# Patient Record
Sex: Female | Born: 1970 | Race: White | Hispanic: No | Marital: Married
Health system: Southern US, Community
[De-identification: ages and names within clinical notes are randomized; demographics above are authoritative.]

---

## 2008-03-11 ENCOUNTER — Ambulatory Visit: Payer: Self-pay

## 2008-05-12 ENCOUNTER — Ambulatory Visit: Payer: Self-pay

## 2011-08-23 ENCOUNTER — Ambulatory Visit: Payer: Self-pay

## 2011-08-31 ENCOUNTER — Ambulatory Visit: Payer: Self-pay

## 2011-09-12 ENCOUNTER — Encounter: Payer: Self-pay | Admitting: Maternal & Fetal Medicine

## 2011-09-12 LAB — URINALYSIS, COMPLETE
Bilirubin,UR: NEGATIVE
Glucose,UR: NEGATIVE mg/dL (ref 0–75)
Ketone: NEGATIVE
Nitrite: NEGATIVE
Ph: 8 (ref 4.5–8.0)
Protein: NEGATIVE
RBC,UR: 2 /HPF (ref 0–5)
WBC UR: 17 /HPF (ref 0–5)

## 2011-09-13 ENCOUNTER — Encounter: Payer: Self-pay | Admitting: Pediatric Cardiology

## 2011-09-17 ENCOUNTER — Observation Stay: Payer: Self-pay | Admitting: Obstetrics & Gynecology

## 2011-09-17 LAB — URINALYSIS, COMPLETE
Glucose,UR: NEGATIVE mg/dL (ref 0–75)
Ketone: NEGATIVE
Ph: 7 (ref 4.5–8.0)
Protein: NEGATIVE
Squamous Epithelial: 2

## 2011-09-29 ENCOUNTER — Encounter: Payer: Self-pay | Admitting: Obstetrics and Gynecology

## 2011-09-29 LAB — URINALYSIS, COMPLETE
Blood: NEGATIVE
Glucose,UR: NEGATIVE mg/dL (ref 0–75)
Ph: 7 (ref 4.5–8.0)
Protein: 30
Specific Gravity: 1.021 (ref 1.003–1.030)
Squamous Epithelial: 20

## 2011-09-30 ENCOUNTER — Observation Stay: Payer: Self-pay | Admitting: Obstetrics and Gynecology

## 2011-09-30 LAB — PIH PROFILE
Anion Gap: 12 (ref 7–16)
BUN: 10 mg/dL (ref 7–18)
Calcium, Total: 9 mg/dL (ref 8.5–10.1)
Chloride: 107 mmol/L (ref 98–107)
EGFR (African American): >60
Glucose: 102 mg/dL — ABNORMAL HIGH (ref 65–99)
HCT: 32.8 % — ABNORMAL LOW (ref 35.0–47.0)
HGB: 10.9 g/dL — ABNORMAL LOW (ref 12.0–16.0)
MCHC: 33.1 g/dL (ref 32.0–36.0)
Platelet: 140 10*3/uL — ABNORMAL LOW (ref 150–440)
Potassium: 3.6 mmol/L (ref 3.5–5.1)
RBC: 3.43 10*6/uL — ABNORMAL LOW (ref 3.80–5.20)
RDW: 14 % (ref 11.5–14.5)
SGOT(AST): 17 U/L (ref 15–37)
Uric Acid: 4.4 mg/dL (ref 2.6–6.0)
WBC: 7.8 10*3/uL (ref 3.6–11.0)

## 2011-09-30 LAB — PROTEIN / CREATININE RATIO, URINE
Creatinine, Urine: 59.4 mg/dL (ref 30.0–125.0)
Protein, Random Urine: 26 mg/dL — ABNORMAL HIGH (ref 0–12)

## 2011-10-01 ENCOUNTER — Observation Stay: Payer: Self-pay | Admitting: Advanced Practice Midwife

## 2011-10-03 ENCOUNTER — Observation Stay: Payer: Self-pay

## 2011-10-03 LAB — PIH PROFILE
Anion Gap: 11 (ref 7–16)
Calcium, Total: 8.7 mg/dL (ref 8.5–10.1)
Chloride: 108 mmol/L — ABNORMAL HIGH (ref 98–107)
Co2: 19 mmol/L — ABNORMAL LOW (ref 21–32)
Creatinine: 0.59 mg/dL — ABNORMAL LOW (ref 0.60–1.30)
EGFR (African American): >60
EGFR (Non-African Amer.): >60
HCT: 33.4 % — ABNORMAL LOW (ref 35.0–47.0)
HGB: 11.1 g/dL — ABNORMAL LOW (ref 12.0–16.0)
MCH: 31.8 pg (ref 26.0–34.0)
MCHC: 33.1 g/dL (ref 32.0–36.0)
Osmolality: 275 (ref 275–301)
Potassium: 3.9 mmol/L (ref 3.5–5.1)
RDW: 14.2 % (ref 11.5–14.5)
Sodium: 138 mmol/L (ref 136–145)
Uric Acid: 4.5 mg/dL (ref 2.6–6.0)
WBC: 5.6 10*3/uL (ref 3.6–11.0)

## 2011-10-03 LAB — PROTEIN / CREATININE RATIO, URINE: Protein, Random Urine: 46 mg/dL — ABNORMAL HIGH (ref 0–12)

## 2011-10-03 LAB — PROTEIN, URINE, 24 HOUR: Protein, 24 Hour Urine: 125 mg/24HR (ref 30–149)

## 2011-10-03 LAB — CREATININE, URINE, 24 HOUR: Creatinine, Urine: 114.1 mg/dL (ref 30.0–125.0)

## 2011-10-04 ENCOUNTER — Ambulatory Visit: Payer: Self-pay

## 2011-10-09 ENCOUNTER — Observation Stay: Payer: Self-pay | Admitting: Obstetrics and Gynecology

## 2011-10-13 ENCOUNTER — Encounter: Payer: Self-pay | Admitting: Obstetrics and Gynecology

## 2011-10-13 LAB — URINALYSIS, COMPLETE
Glucose,UR: NEGATIVE mg/dL (ref 0–75)
Ph: 6 (ref 4.5–8.0)
RBC,UR: 2 /HPF (ref 0–5)
Specific Gravity: 1.02 (ref 1.003–1.030)
Transitional Epi: <1
WBC UR: 28 /HPF (ref 0–5)

## 2011-10-15 ENCOUNTER — Inpatient Hospital Stay: Payer: Self-pay | Admitting: Obstetrics and Gynecology

## 2011-10-15 LAB — CBC WITH DIFFERENTIAL/PLATELET
Basophil #: 0 10*3/uL (ref 0.0–0.1)
Eosinophil %: 1.5 %
HCT: 32.9 % — ABNORMAL LOW (ref 35.0–47.0)
Lymphocyte %: 19.6 %
MCV: 95 fL (ref 80–100)
Monocyte %: 8.8 %
Platelet: 112 10*3/uL — ABNORMAL LOW (ref 150–440)
RDW: 14.5 % (ref 11.5–14.5)
WBC: 5.6 10*3/uL (ref 3.6–11.0)

## 2011-10-15 LAB — PROTEIN, URINE, 24 HOUR
Collection Hours: 24 hours
Protein, 24 Hour Urine: 263 mg/24HR — ABNORMAL HIGH (ref 30–149)
Total Volume: 1750 mL

## 2011-10-19 LAB — PATHOLOGY REPORT

## 2012-12-07 IMAGING — US US OB DETAIL+14 WK - NRPT [facility]
1 series · 14 of 28 positions shown · non-contrast
Comparison: none

[Series 1: us ob detail+14 wk - nrpt [facility] · 14 of 82 slices shown]
[im 4/82]
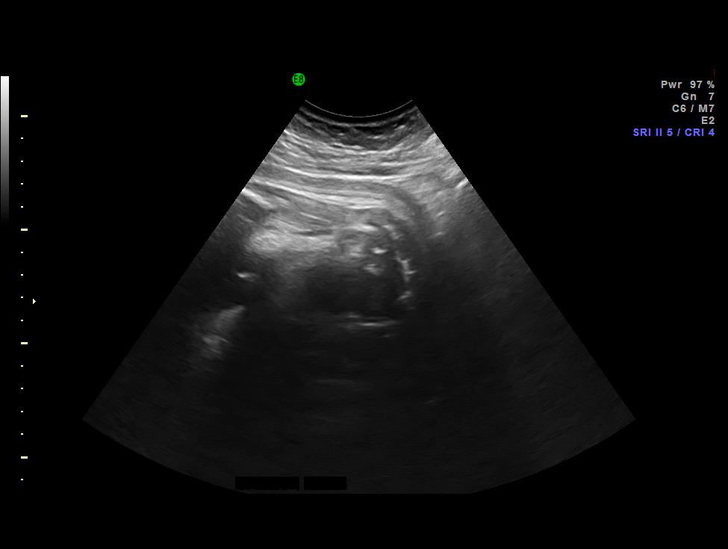
[im 10/82]
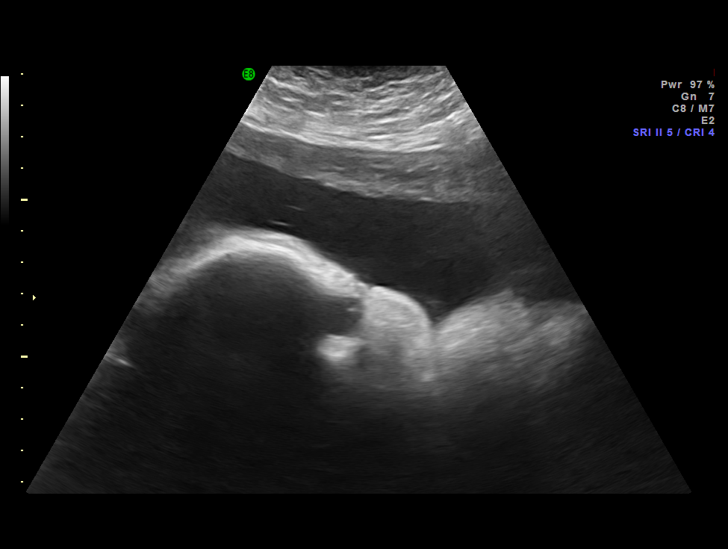
[im 16/82]
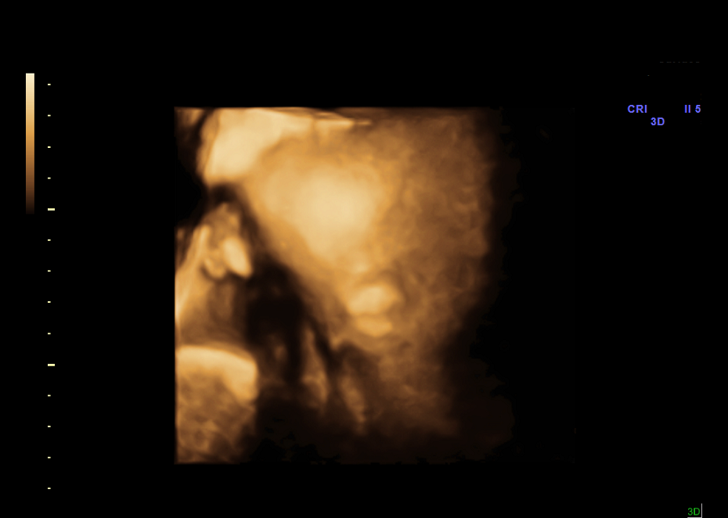
[im 22/82]
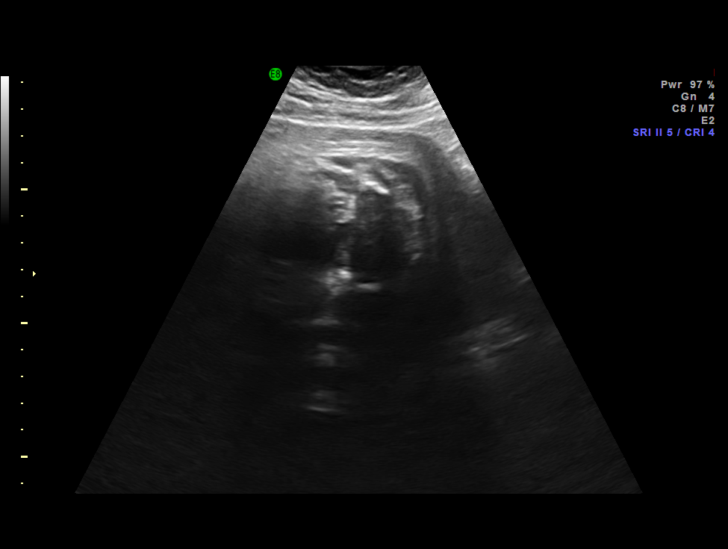
[im 28/82]
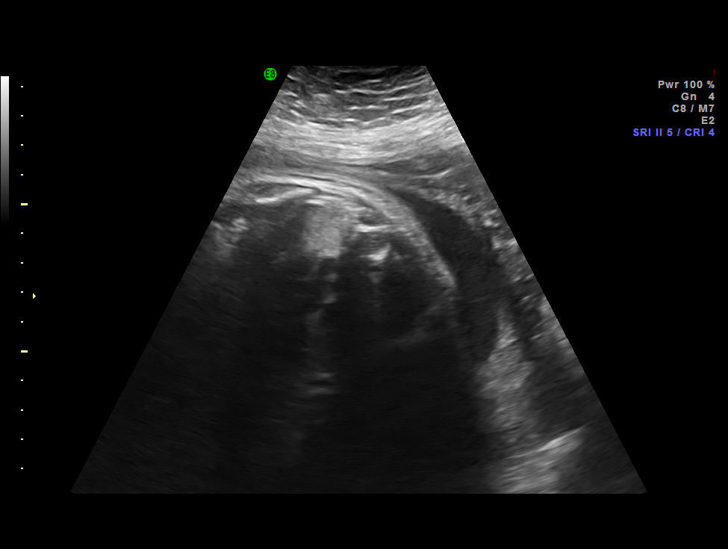
[im 34/82]
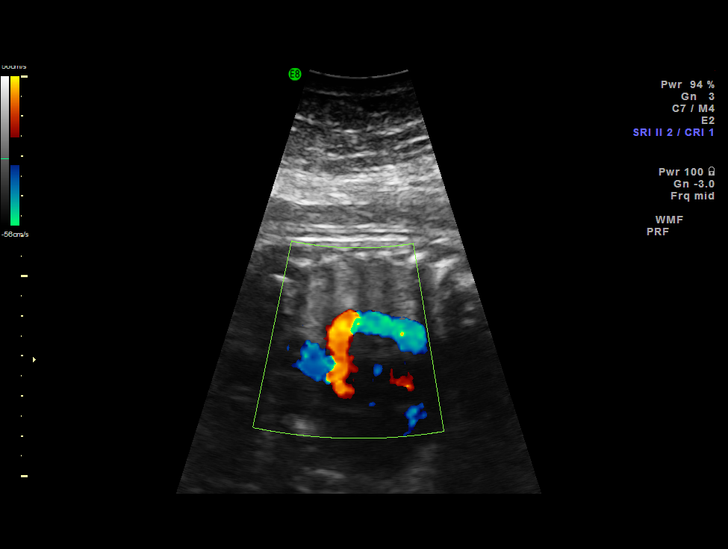
[im 40/82]
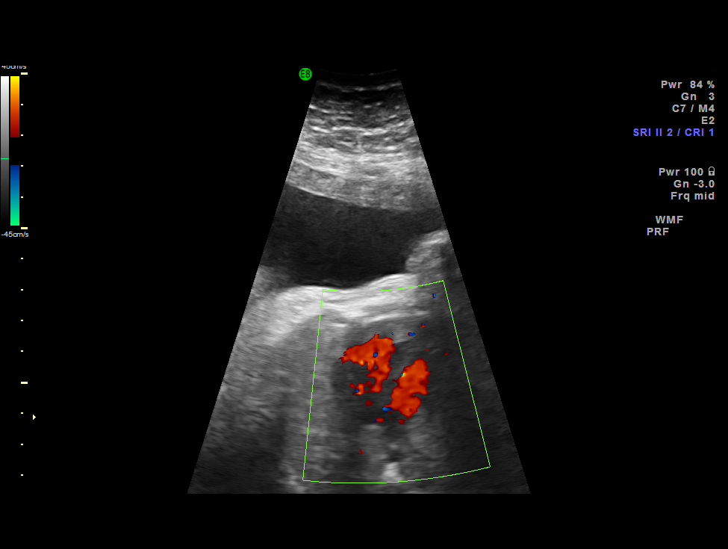
[im 46/82]
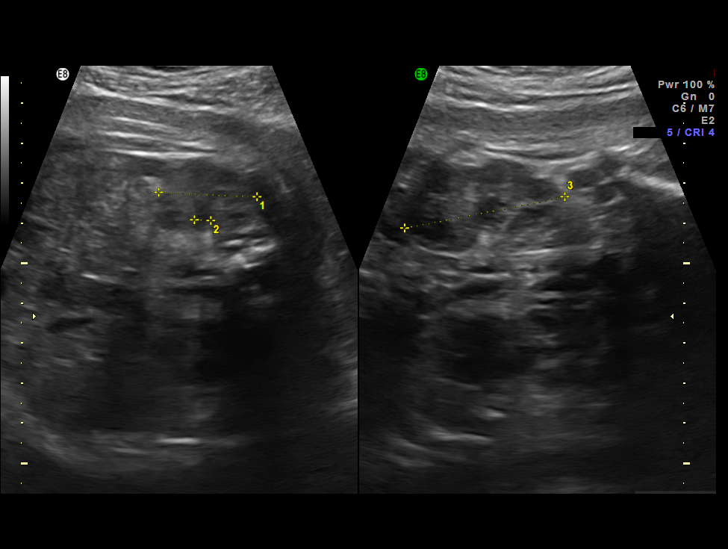
[im 52/82]
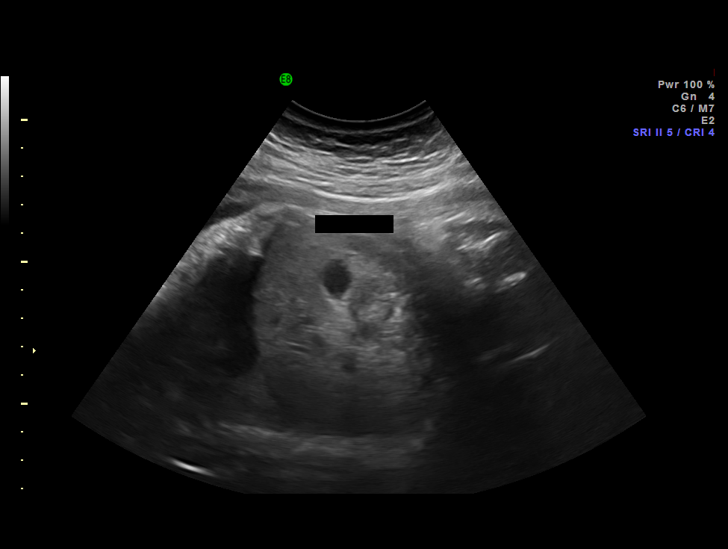
[im 58/82]
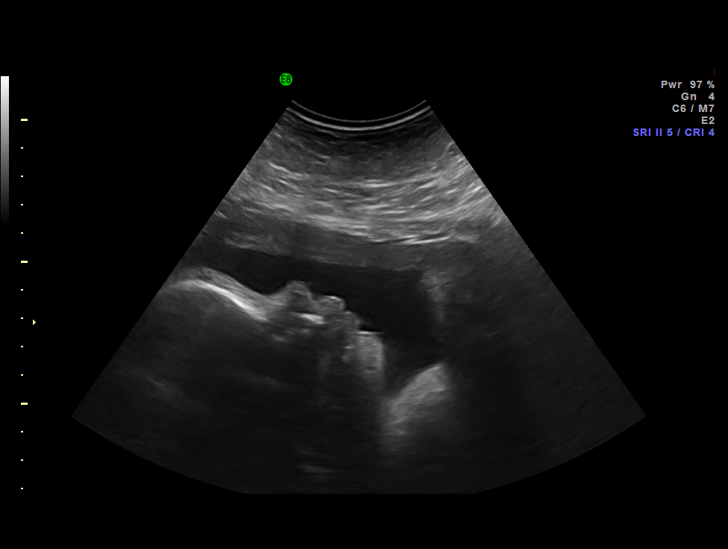
[im 64/82]
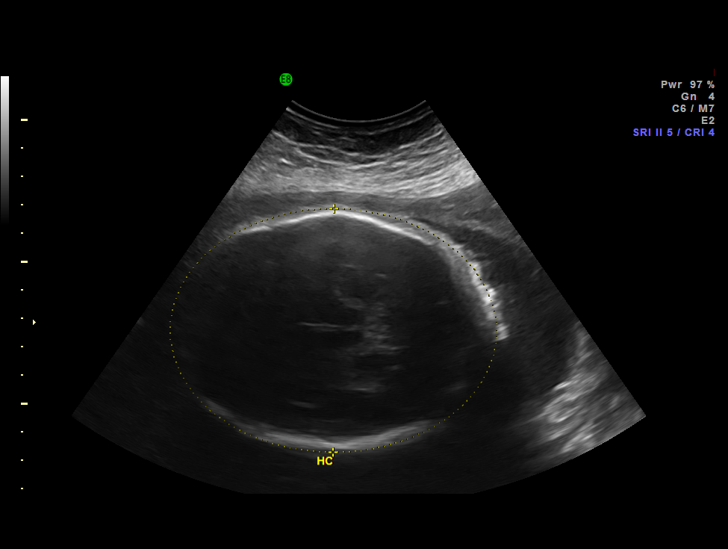
[im 70/82]
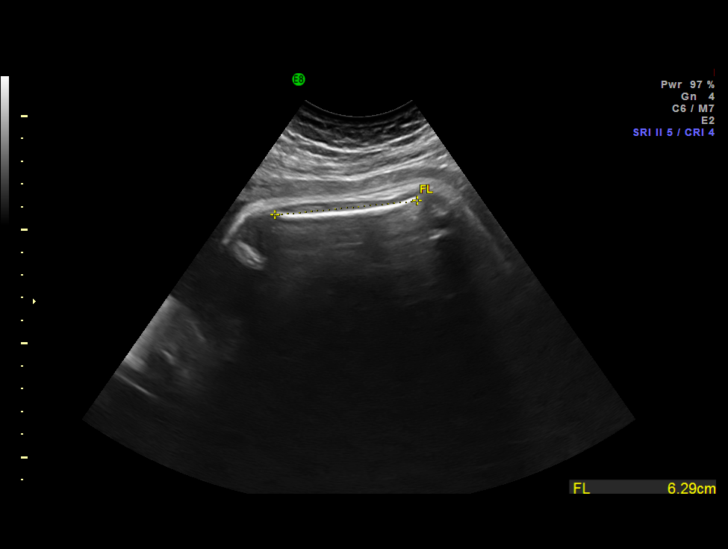
[im 76/82]
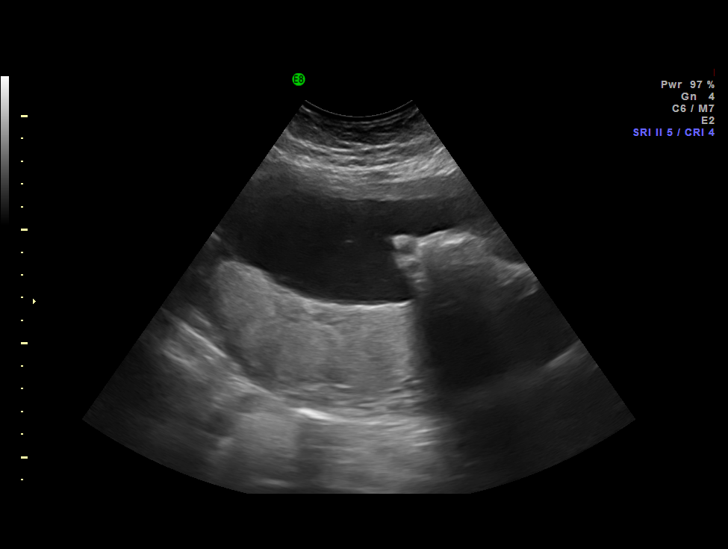
[im 82/82]
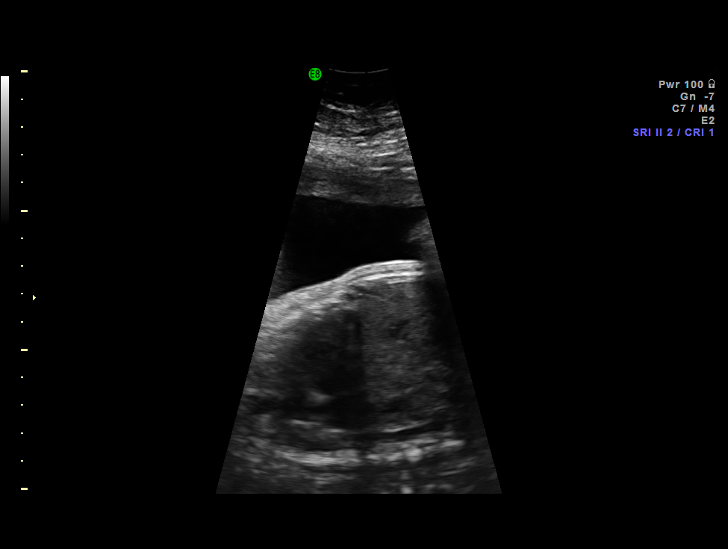

[14 of 28 positions shown; findings below may reference images not displayed]

IMAGES IMPORTED FROM THE SYN[employer] WORKFLOW SYSTEM
NO DICTATION FOR STUDY

## 2013-01-07 IMAGING — US US FETAL BPP W/O NON-STRESS - NRPT
1 series · 9 of 9 positions shown · non-contrast
Comparison: none

[Series 1: us fetal bpp w/o non-stress - nrpt · 0.26mm/px · 9 of 9 slices shown]
[im 1/9]
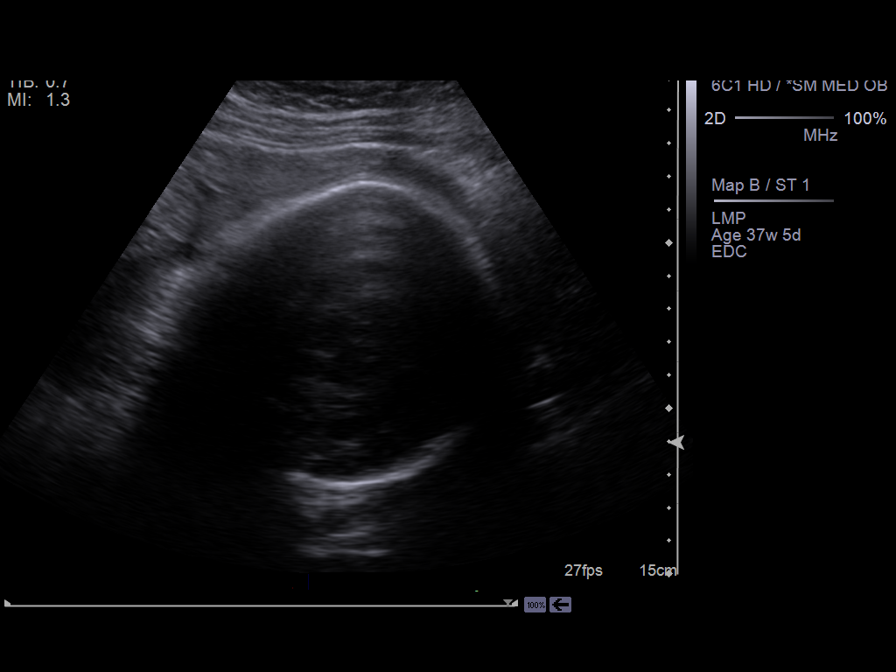
[im 2/9]
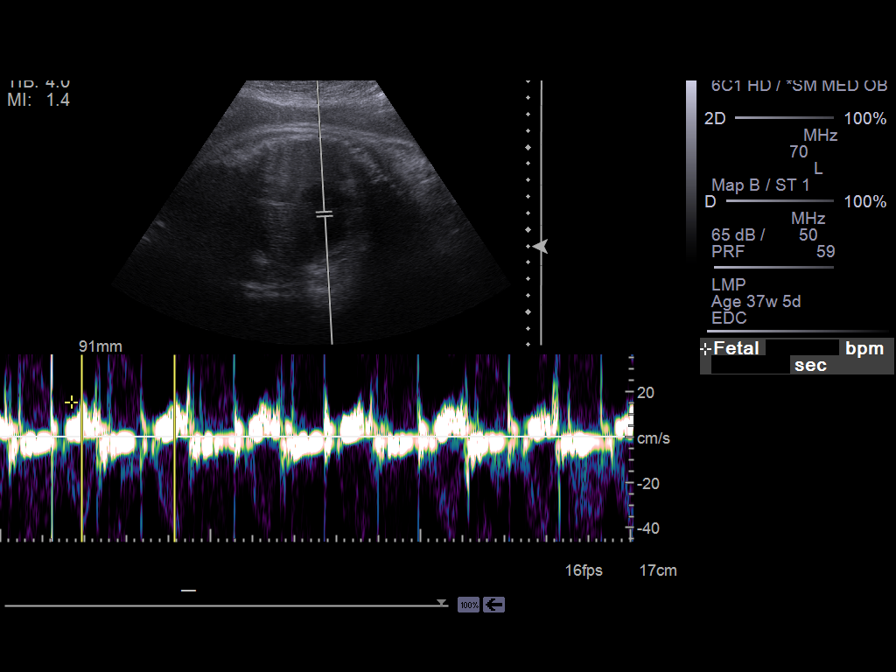
[im 3/9]
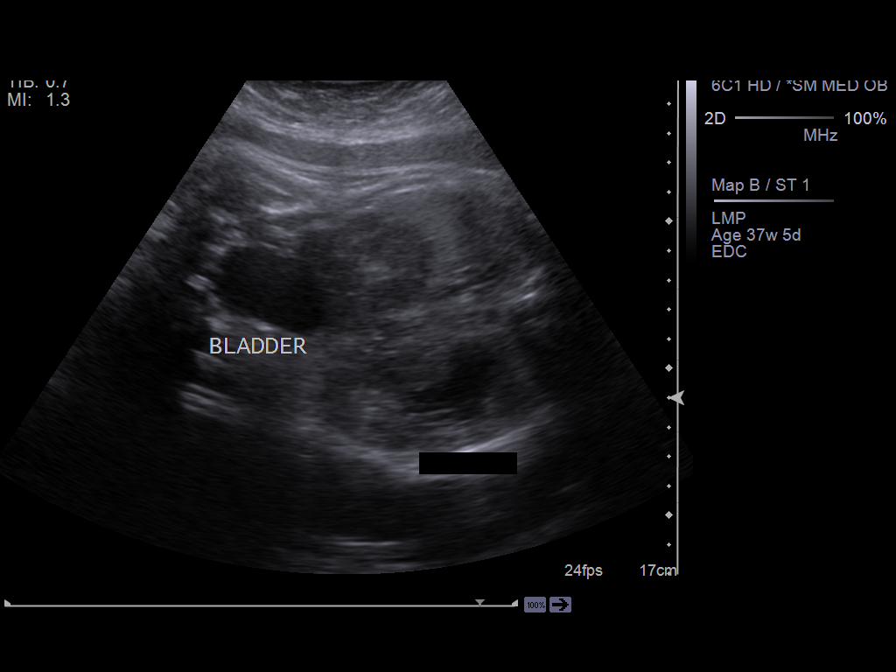
[im 4/9]
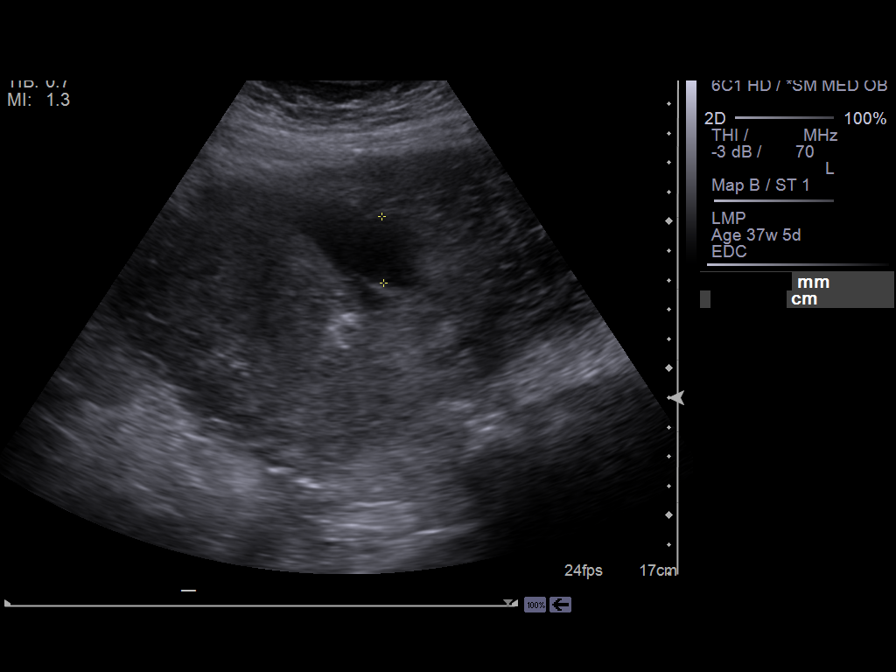
[im 5/9]
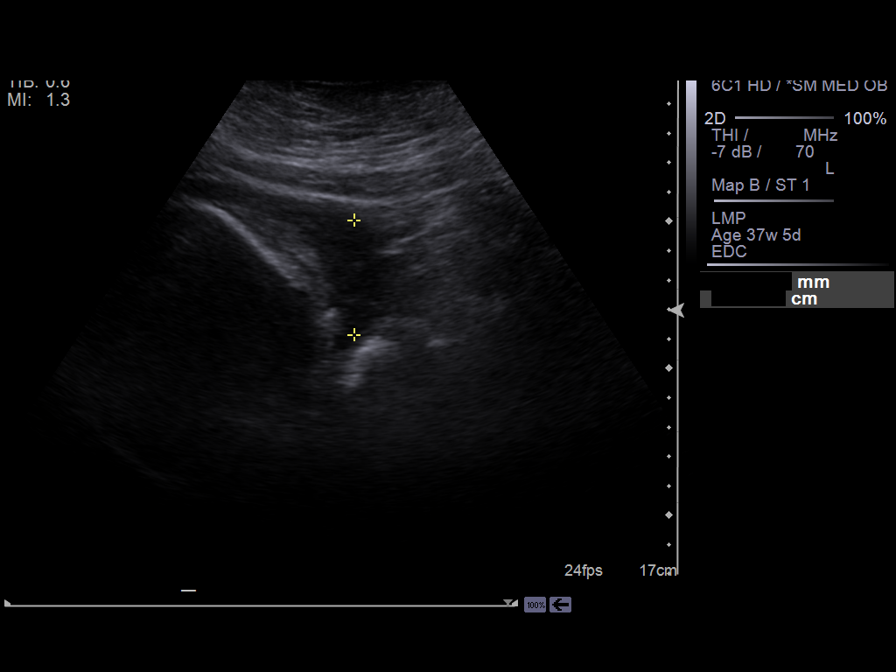
[im 6/9]
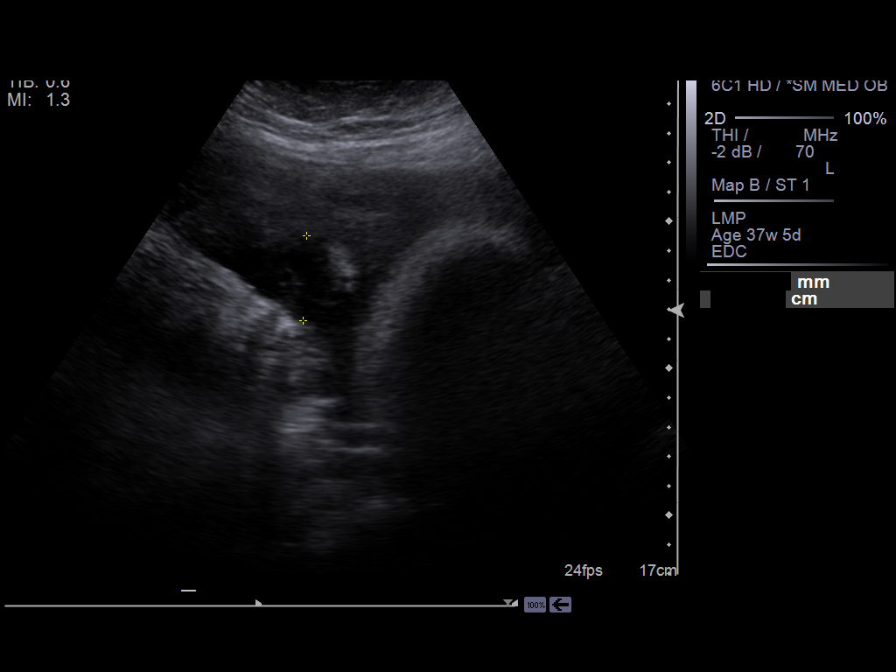
[im 7/9]
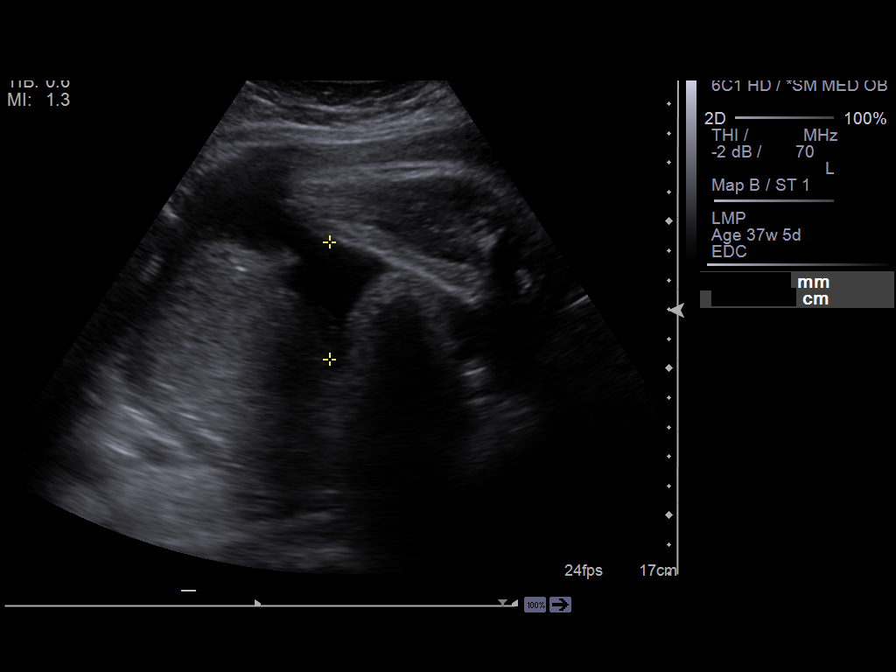
[im 8/9]
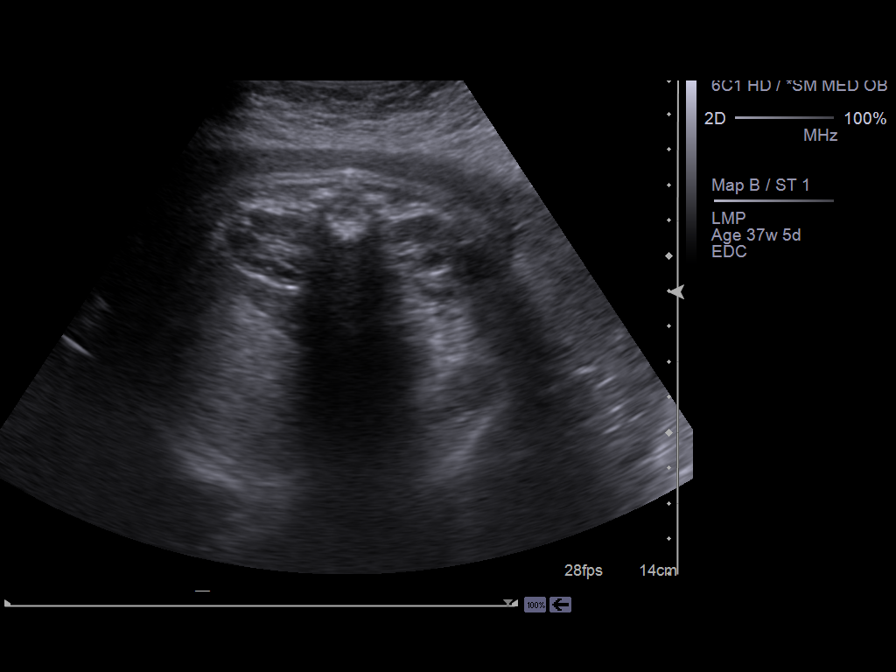
[im 9/9]
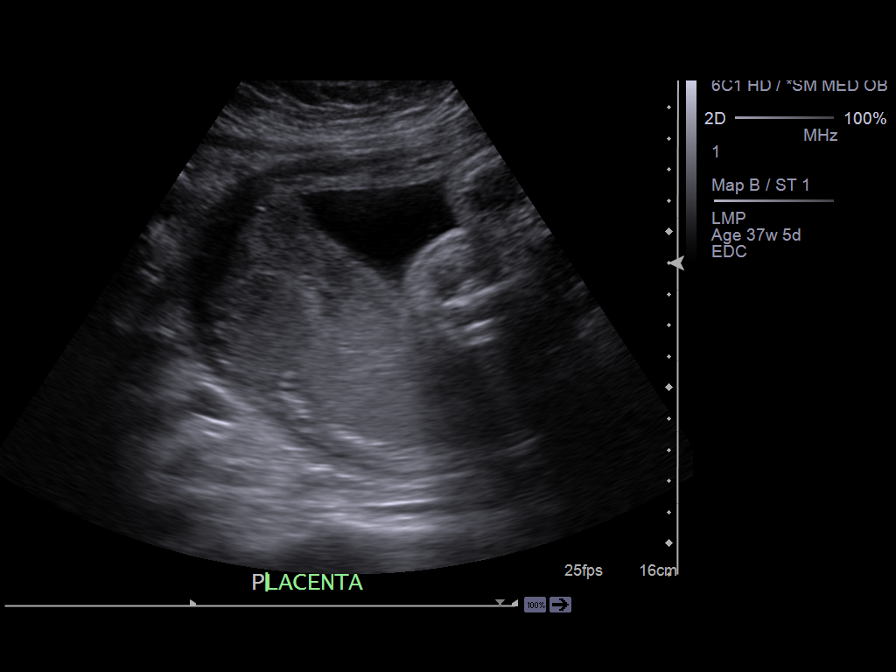

[9 of 9 positions shown; findings below may reference images not displayed]

IMAGES IMPORTED FROM THE SYNGO WORKFLOW SYSTEM
NO DICTATION FOR STUDY

## 2013-10-11 IMAGING — US US FETAL BPP W/O NON-STRESS - NRPT
1 series · 12 of 12 positions shown · non-contrast
Comparison: none

[Series 1: us fetal bpp w/o non-stress - nrpt · 0.28mm/px · 12 acquisitions, 12 frames shown]
[im 1/12]
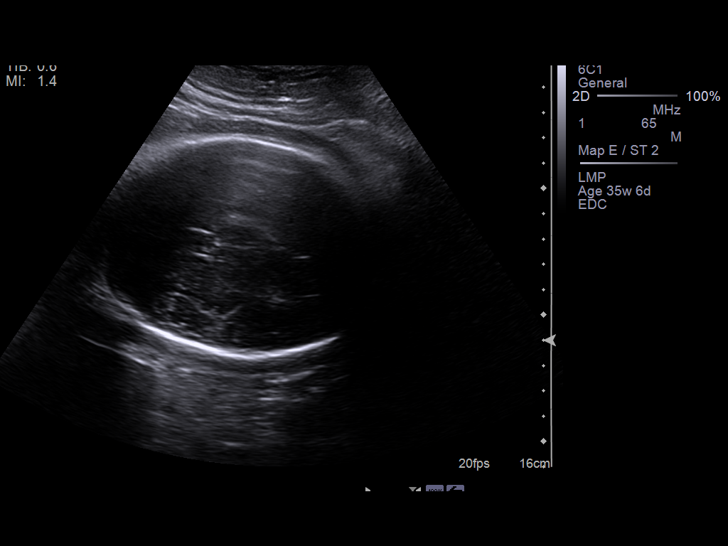
[im 2/12]
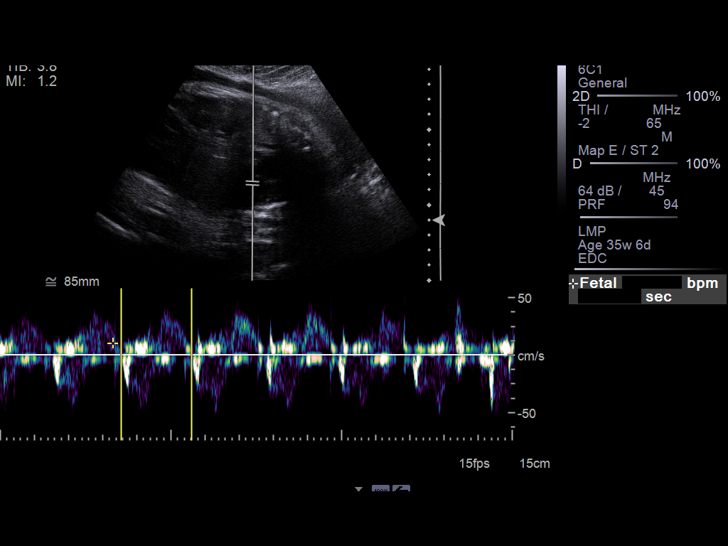
[im 3/12]
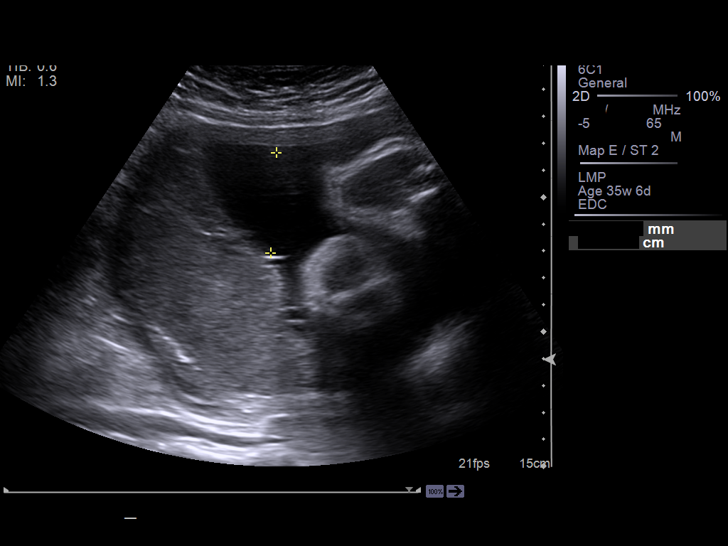
[im 4/12]
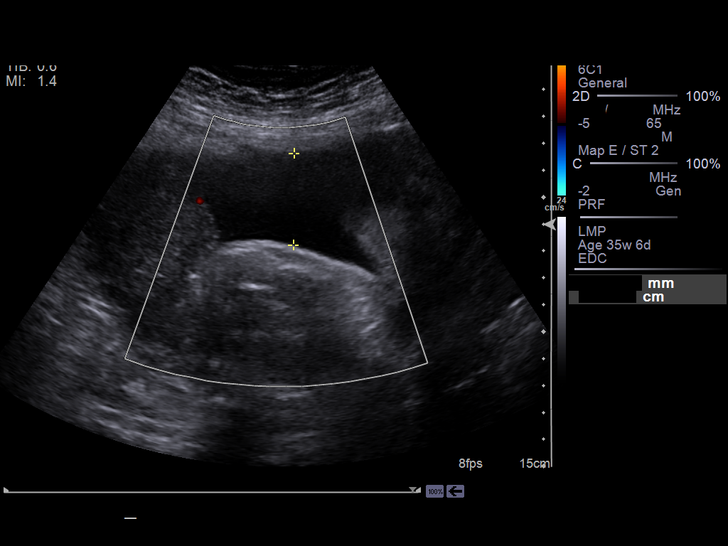
[im 5/12]
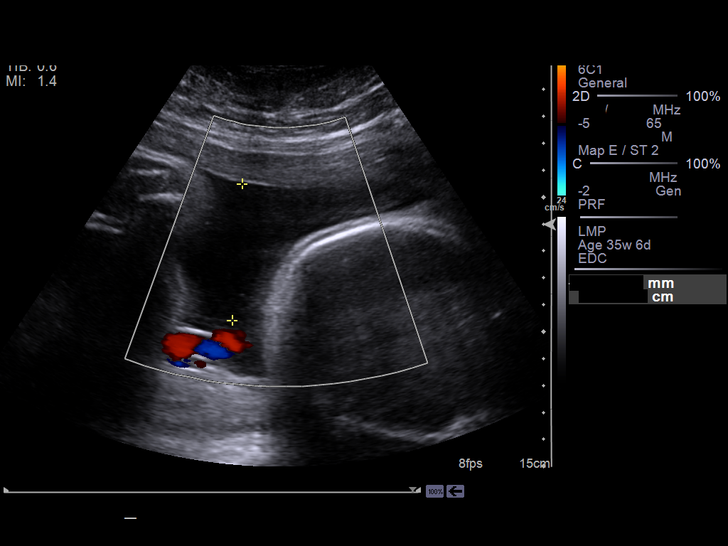
[im 6/12]
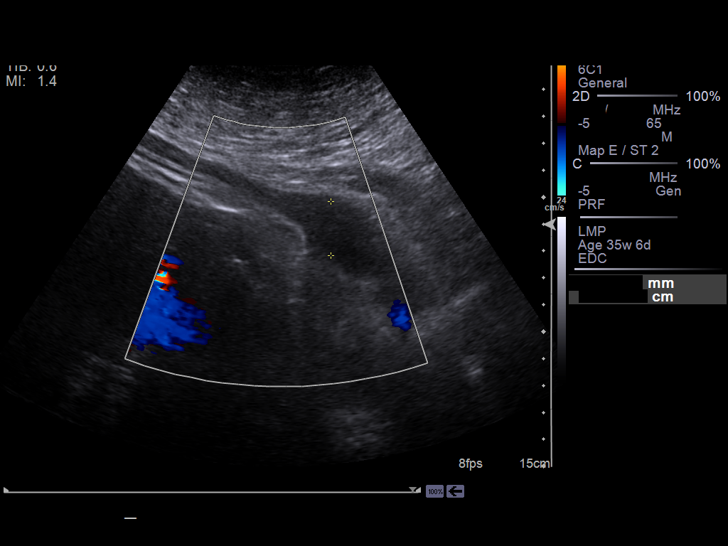
[im 7/12]
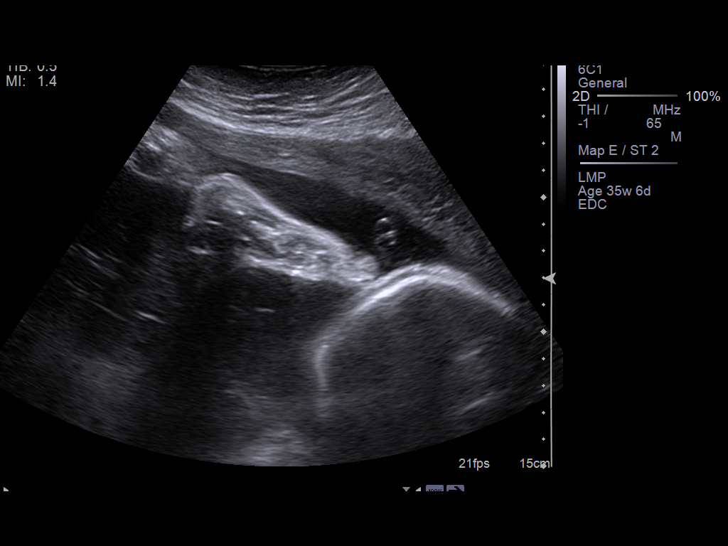
[im 8/12]
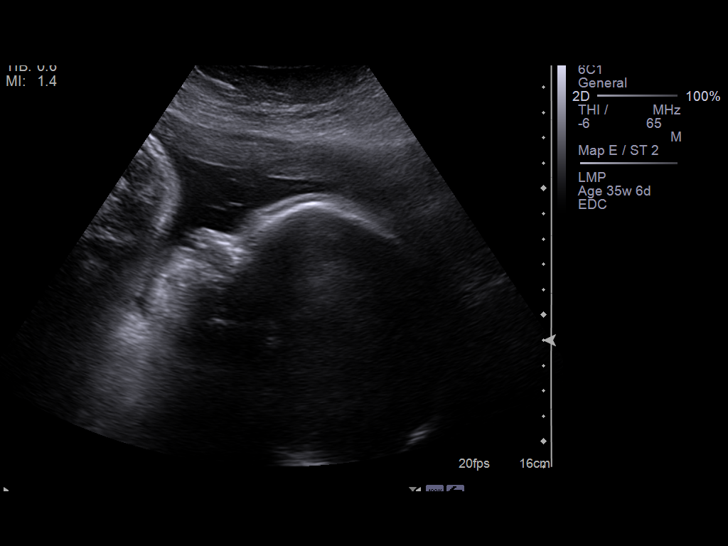
[im 9/12]
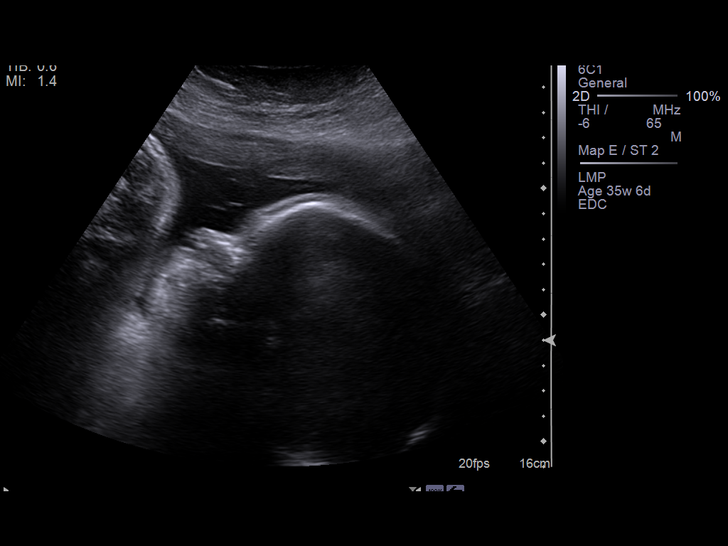
[im 10/12]
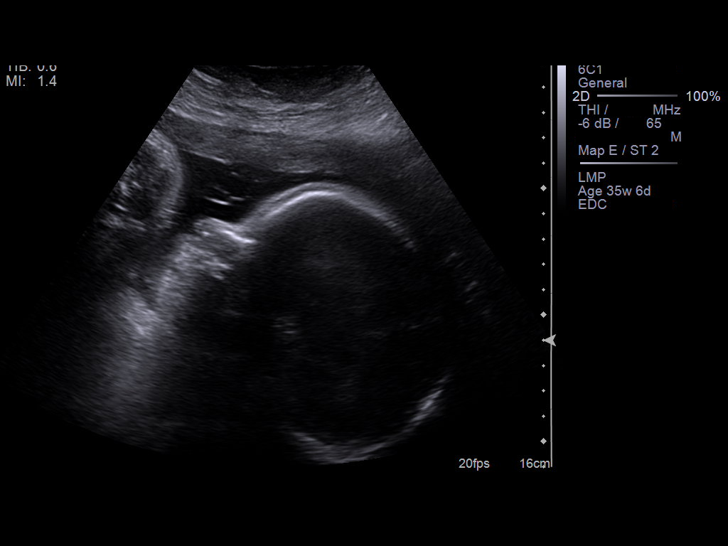
[im 11/12]
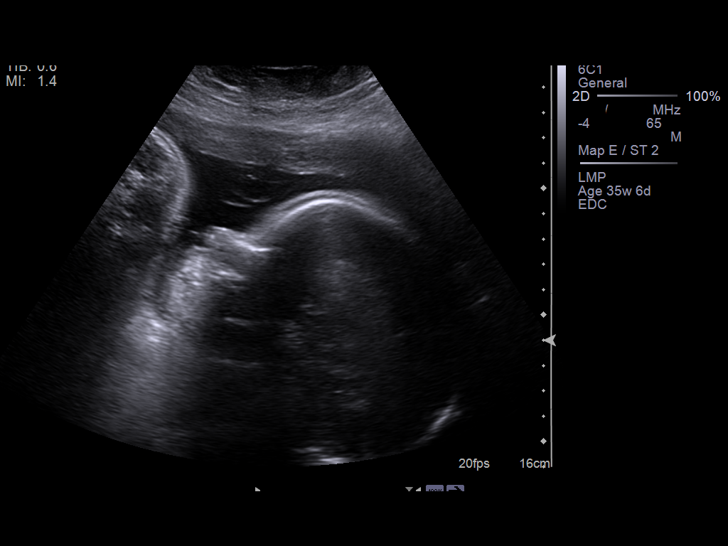
[im 12/12]
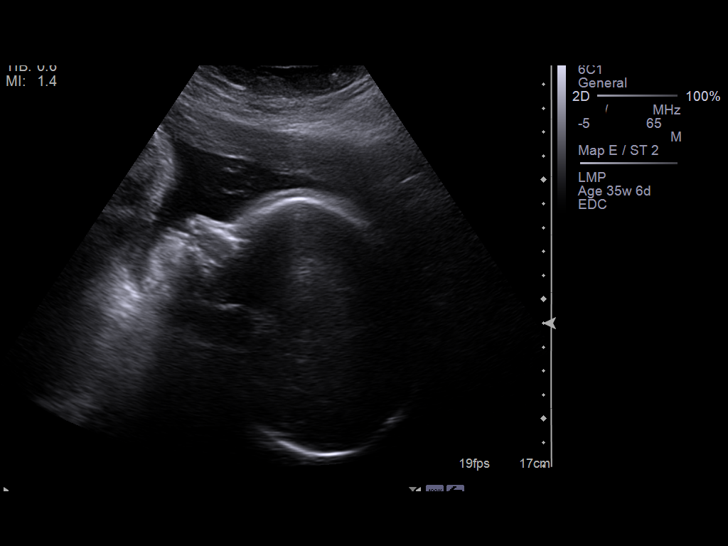

[12 of 12 positions shown; findings below may reference images not displayed]

IMAGES IMPORTED FROM THE SYNGO WORKFLOW SYSTEM
NO DICTATION FOR STUDY

## 2013-12-27 ENCOUNTER — Emergency Department: Payer: Self-pay | Admitting: Emergency Medicine

## 2013-12-27 LAB — URINALYSIS, COMPLETE
Bacteria: NONE SEEN
Bilirubin,UR: NEGATIVE
Glucose,UR: NEGATIVE mg/dL (ref 0–75)
KETONE: NEGATIVE
LEUKOCYTE ESTERASE: NEGATIVE
Nitrite: NEGATIVE
PH: 6 (ref 4.5–8.0)
PROTEIN: NEGATIVE
SPECIFIC GRAVITY: 1.025 (ref 1.003–1.030)
Squamous Epithelial: 1
WBC UR: <1 /HPF (ref 0–5)

## 2013-12-27 LAB — COMPREHENSIVE METABOLIC PANEL
ALBUMIN: 3.2 g/dL — AB (ref 3.4–5.0)
ALK PHOS: 75 U/L
Anion Gap: 3 — ABNORMAL LOW (ref 7–16)
BUN: 16 mg/dL (ref 7–18)
Bilirubin,Total: 0.2 mg/dL (ref 0.2–1.0)
CALCIUM: 8.2 mg/dL — AB (ref 8.5–10.1)
CREATININE: 0.83 mg/dL (ref 0.60–1.30)
Chloride: 108 mmol/L — ABNORMAL HIGH (ref 98–107)
Co2: 28 mmol/L (ref 21–32)
EGFR (Non-African Amer.): >60
Glucose: 103 mg/dL — ABNORMAL HIGH (ref 65–99)
OSMOLALITY: 279 (ref 275–301)
POTASSIUM: 4.1 mmol/L (ref 3.5–5.1)
SGOT(AST): 23 U/L (ref 15–37)
SGPT (ALT): 29 U/L
SODIUM: 139 mmol/L (ref 136–145)
Total Protein: 6.9 g/dL (ref 6.4–8.2)

## 2013-12-27 LAB — CBC WITH DIFFERENTIAL/PLATELET
Basophil #: 0 10*3/uL (ref 0.0–0.1)
Basophil %: 0.7 %
Eosinophil #: 0.2 10*3/uL (ref 0.0–0.7)
Eosinophil %: 4.2 %
HCT: 27.1 % — AB (ref 35.0–47.0)
HGB: 8.6 g/dL — ABNORMAL LOW (ref 12.0–16.0)
LYMPHS PCT: 32 %
Lymphocyte #: 1.8 10*3/uL (ref 1.0–3.6)
MCH: 29.5 pg (ref 26.0–34.0)
MCHC: 31.8 g/dL — ABNORMAL LOW (ref 32.0–36.0)
MCV: 93 fL (ref 80–100)
MONO ABS: 0.5 x10 3/mm (ref 0.2–0.9)
Monocyte %: 8.8 %
Neutrophil #: 3.1 10*3/uL (ref 1.4–6.5)
Neutrophil %: 54.3 %
PLATELETS: 203 10*3/uL (ref 150–440)
RBC: 2.92 10*6/uL — ABNORMAL LOW (ref 3.80–5.20)
RDW: 14.2 % (ref 11.5–14.5)
WBC: 5.8 10*3/uL (ref 3.6–11.0)

## 2013-12-27 LAB — PREGNANCY, URINE: PREGNANCY TEST, URINE: NEGATIVE m[IU]/mL

## 2014-08-24 NOTE — Consult Note (Signed)
Referral Information:   Reason for Referral 44 yo G2P1001 at 353 weeks 5 days by LMP (EDD 10/29/11) consistent with 6w 5 day [facility].  She presents for follow up review of her blood sugars since being started on glyburide.    Referring Physician [facility]    Prenatal Hx Pregnancy also complicated by depression (was on Paxil, now on Zoloft), AMA, hypothyroidism, elevated BMI.    Past Obstetrical Hx 1996, SVD of live female , 7lb 9 oz, no complications, at term   Home Medications: Medication Instructions Status  Zoloft 25 mg oral tablet tab(s) orally  Active  glyBURIDE 2.5 mg oral tablet 1 tab(s) orally once a day (at bedtime) Active  Prenatal Multivitamins oral tablet 1 tab(s) orally once a day Active  glyBURIDE 1.25 mg oral tablet 1 tab(s) orally once a day (in the morning) Active  Levoxyl 100 mcg (0.1 mg) oral tablet 1 tab(s) orally once a day Active  Levoxyl 112 mcg (0.112 mg) oral tablet 1 tab(s) orally once a day Active   Allergies:   No Known Allergies:   Vital Signs/Notes:  Nursing Vital Signs: **Vital Signs.:   30-May-13 13:12   Vital Signs Type Routine   Pulse Pulse 91   Pulse source per Dinamap   Respirations Respirations 16   Systolic BP Systolic BP 138   Diastolic BP (mmHg) Diastolic BP (mmHg) 83   Mean BP 101   BP Source vital sign device   Impression/Recommendations:   Impression 44 year old with gestational diabetes currently at 35 weeks 5 days. Current dose of glyburide is 2.5mg  qhs and 1.25mg  q morning (was having lows in the middle of the night witih 5mg  qhs dose). FBS range from 80 to 121 (outlier) 2hr PP breakfast range from 101 to 160 (95% in range) 2hr PP lunch range from 89 go 143 (95% in range) 2hr PP dinner range from 91 to 149 (99% in range)    Recommendations 1.  Continue diabetic diet and exercise --Continue current glyburide dose --Recommend 2x weekly NSTs and weekly fluid checks --Given normal EFW at 33 weeks  (62nd percentile), recommend EFW around 37 weeks and assuming no evidence of fetopathy, consider delivery at about 39 weeks.  Note that first delivery was a vaginal after induction of labor (16 years ago!) 2.  Depression currently on Zoloft.  Had a "grossly normal" fetal echocardiogram (due to early Paxil exposure and AMA status).  She plans to resume Paxil postpartum since it has worked better for her.  Monitor for PP depression. 3. Follow up in 2 weeks (with BPP). Scheduled.  4.  Follow up EFW was not scheduled; we would be more than happy to perform this if desired.     Total Time Spent with Patient 10 minutes    >50% of visit spent in couseling/coordination of care yes    Office Use Only [id number]  Office Visit Level 2 (10min) EST prob focused office/outpt   Coding Description: MATERNAL CONDITIONS/HISTORY INDICATION(S).   Adv. Maternal Age - multigravida.   Diabetes - Gestational GDM.   Obesity - BMI greater than equal to 30.  Electronic Signatures: Kirby FunkEllestad, Tajia Szeliga (MD)  (Signed 30-May-13 13:48)  Authored: Referral, Home Medications, Allergies, Vital Signs/Notes, Impression, Billing, Coding Description   Last Updated: 30-May-13 13:48 by Kirby FunkEllestad, Vivica Dobosz (MD)

## 2014-08-24 NOTE — Consult Note (Signed)
Referral Information:   Reason for Referral Review sugar log    Referring Physician [facility]    Prenatal Hx 44 year-old G2 p1001 at 4737 5/7 weeks wtih gestational diabetes here to review her sugar log. She is currently taking glyburide 1.125 mg in the mornnign and 2.5 mg at bedtime.  She is without complaints. She states she is mostly following her diet but occasionally eats things she knows that she shouldn't.  She had a fetal echo for the indication of Paxil exposure, which was grossly normal.  She is [facility] and has a growth ultrasound scheduled there today.    Past Obstetrical Hx See prior consult notes.   Home Medications: Medication Instructions Status  Zoloft 25 mg oral tablet tab(s) orally  Active  glyBURIDE 2.5 mg oral tablet 1 tab(s) orally once a day (at bedtime) Active  Prenatal Multivitamins oral tablet 1 tab(s) orally once a day Active  glyBURIDE 1.25 mg oral tablet 1 tab(s) orally once a day (in the morning) Active  Levoxyl 100 mcg (0.1 mg) oral tablet 1 tab(s) orally once a day Active  Levoxyl 112 mcg (0.112 mg) oral tablet 1 tab(s) orally once a day Active   Allergies:   No Known Allergies:   Vital Signs/Notes:  Nursing Vital Signs: **Vital Signs.:   13-Jun-13 08:43   Pulse Pulse 97   Systolic BP Systolic BP 117   Diastolic BP (mmHg) Diastolic BP (mmHg) 71     Additional Lab/Radiology Notes Blood sugar log (5/30-6/13): FS: 94, 99, 111, 76, 88, 115, 106, 110, 117, 120, 113, 112, 98, 108 2hr PP breakfast: 130, 133, 99, 105, 94, 98, 129, 109, 118, 105 2hr PP lunch: 95, 88, 108, 79, 127 2hr PP dinner: 117, 123, 99, 99, 113, 130, 121, 141, 118   Impression/Recommendations:   Impression 44 year-old G2 P1001 at 2437 5/7 weeks with gestational diabetes on glyburide.  Fasting sugars have slowly crept up in the last two weeks while on current treatment regimen. 2hr values are mostly in range.    Recommendations -Change  glyburide to 5 mg at bedtime and continue 1.125 mg in the am -Continue to follow diet and exercise recommnedations -Continue twice-weekly fetal testing -[facility]. If still no evidence of fetopathy on that scan, than recommend coninuation of twice weekly testing and delivery at approximately 39 weeks.  If evidence of fetaopathy on today's scan (macrosomia, polyhydramnios) than recommend delivery at 37-38 weeks with documentation of fetal lung maturity. -Pt at risk for postpartum depression. Continue SSRI. Pt aware of symptoms and knows to call. Recommend early followup postpartum to screen for symptoms.     Total Time Spent with Patient 15 minutes    >50% of visit spent in couseling/coordination of care yes    Office Use Only [id number]  Office Visit Level 3 (15min) EST exp prob focused outpt   Coding Description: MATERNAL CONDITIONS/HISTORY INDICATION(S).   Adv. Maternal Age - multigravida.   Diabetes - Gestational GDM.  Electronic Signatures: Ladan Vanderzanden, [place] (MD)  (Signed 13-Jun-13 09:08)  Authored: Referral, Home Medications, Allergies, Vital Signs/Notes, Lab/Radiology Notes, Impression, Billing, Coding Description   Last Updated: 13-Jun-13 09:08 by Charle Mclaurin, [place]had (MD)

## 2014-08-24 NOTE — Op Note (Signed)
PATIENT NAME:  Emily Gonzales, Emily Gonzales MR#:  [id number] DATE OF [place]:  1971/03/16  DATE OF PROCEDURE:  10/15/2011  PREOPERATIVE DIAGNOSES:  1. Intrauterine pregnancy at term.  2. Fetal intolerance of labor.  3. Desires permanent sterilization.   POSTOPERATIVE DIAGNOSES: 1. Intrauterine pregnancy at term.  2. Fetal intolerance of labor.  3. Desires permanent sterilization.   PROCEDURES:  1. Low transverse Cesarean section via Pfannenstiel incision.  2. Bilateral tubal ligation.   ANESTHESIA: Spinal.   SURGEON: Conard NovakStephen Gonzales. Aubree Doody, MD   ASSISTANT: Shella Maximarcia Putnam, CNM   ESTIMATED BLOOD LOSS: 600 mL.  OPERATIVE FLUIDS: 1900 mL of Crystalloid.   COMPLICATIONS: None.   FINDINGS:  1. Gravid uterus with subserosal appearing left fundal fibroid (3 x 3 cm), otherwise normal.  2. Normal appearing bilateral fallopian tubes and ovaries.   SPECIMENS: Portion of right and left tubes to Pathology for permanent.   CONDITION: Stable at the end of the procedure.   INDICATIONS FOR SURGERY: Emily Gonzales is a 44 year old gravida 2 para 1-0-0-1 at 2338 weeks gestational age who presented to Labor and Delivery today for NST and blood pressure check as well as to drop of a 24-hour urine total protein due to increasing blood pressures in the mildly elevated blood pressure range between 140 and 160 systolic. She had a large deceleration in her monitoring phase and was, therefore, admitted. Induction was attempted, however, even with Cervidil cervical ripening she had significant decelerations with contractions, sometimes without. Given the concern for fetal intolerance of contractions and the fetus' inability to tolerate the labor process, it was recommended that she undergo Cesarean delivery. After the need for this was explained to her and questions were answered, she was taken to the operating room.   PROCEDURE IN DETAIL: The patient was taken to the operating room where spinal neuraxial anesthesia was administered  and found to be adequate. She was then placed in the dorsal supine position with a leftward tilt. She was prepped and draped in the usual sterile fashion. A Pfannenstiel incision was used to gain entrance into the abdominal cavity where the peritoneum was identified and entered sharply. The peritoneum was extended in the cranial and caudal direction and a bladder flap was created and the bladder retractor was used to pull the bladder out of the operative area of interest. A low transverse incision was made on the uterus and was extended with cranial and caudal tension. The fetal vertex was then grasped and elevated to the hysterotomy. The fetal vertex was delivered followed by the shoulders and the rest of the body without difficulty. The cord was clamped and cut and the infant was handed to the awaiting pediatrician. Cord blood was collected. The placenta was delivered spontaneously intact with a three vessel cord. The uterus was exteriorized and cleared of all clots and debris. The hysterotomy was closed with #0 Vicryl in a running locked fashion. A second layer of the same suture was used to gain hemostasis.   Attention was then turned to the left fallopian tube which was grasped with a Babcock clamp approximately 3 cm from the cornual region. Approximately a 3 cm segment was suture ligated and removed and hemostasis was obtained. In the same fashion, the right fallopian tube was identified, grasped with a Babcock clamp approximately 3 cm from the cornual region, and suture ligated for approximately a 3 cm segment that was removed and hemostasis was again noted. The hysterotomy was then re-examined and found to be hemostatic and the uterus  was returned to the abdominal cavity. The gutters were then cleared of all clots and debris. The peritoneum was closed with #0 Vicryl in a running fashion.   The On-Q pump catheters were then placed approximately 4 cm cephalad to the incision line. The catheters were  inserted to the level just superficial to the rectus abdominis muscles and just deep to the rectus fascia. They were inserted to the depth of the fourth mark on the catheters. The fascia was then closed with #0 PDS using two separate PDS stitches and they were tied in the middle. The deep half of the subcutaneous area was closed using interrupted #3-0 Vicryl stitches to reapproximate the subcutaneous adipose tissue. The skin was closed with staples. The On-Q catheters were affixed to the skin using Dermabond as well as Steri-Strips and Tegaderm. The On-Q catheters were bolused with 5 mL of 0.5% Marcaine plain for a total of 10 mL. A pressure dressing was applied to the incision site.   The patient tolerated the procedure well. Sponge, lap, and needle counts were correct x2. For antibiotic prophylaxis, the patient was administered 2 grams of Ancef prior to skin incision. For VTE prophylaxis, the patient had pneumatic compression stockings in place and operating throughout the entire procedure.   ____________________________ Conard Novak, MD sdj:drc Gonzales: 10/15/2011 20:25:18 ET T: 10/16/2011 10:59:07 ET JOB#: [id number]  cc: Conard Novak, MD, <Dictator> Conard Novak MD ELECTRONICALLY SIGNED 10/16/2011 22:43

## 2014-08-24 NOTE — Consult Note (Signed)
Referral Information:   Reason for Referral 44 yo G2P1001 at 33 weeks 2 days by LMP (EDD 10/29/11) consistent with 6w 5 day US performed on 03/10/11.  She is here for consultation due to recently diagnosed gestational diabetes(on glyburide 2.5mg  at [facility]), history of anxiety/depression (on paxil during first trimester, now on zoloft) bmi>30, hypothryoidism, and advanced maternal age.    Referring Physician [facility]    Prenatal Hx 1h GTT 195 and all 3h GTT values elevated normal first trimester screening    Past Obstetrical Hx 03/27/12, female , 7lb 9 oz, no complications   Home Medications: Medication Instructions Status  Levoxyl 150 mcg (0.15 mg) oral tablet 1.5 tab(s) orally once a day Active  Zoloft 25 mg oral tablet tab(s) orally  Active  glyBURIDE 2.5 mg oral tablet 1 tab(s) orally once a day (at bedtime) Active  Prenatal Multivitamins oral tablet 1 tab(s) orally once a day Active   Allergies:   No Known Allergies:   Vital Signs/Notes:  Nursing Vital Signs: **Vital Signs.:   13-May-13 09:19   Vital Signs Type Routine   Celsius 37.2   Temperature Source oral   Pulse Pulse 98   Pulse source per Dinamap   Respirations Respirations 16   Systolic BP Systolic BP 137   Diastolic BP (mmHg) Diastolic BP (mmHg) 83   Mean BP 101   BP Source Dinamap   Perinatal Consult:   PMed Hx Rubella Immune    Past Medical History cont'd Hypothryoidism Anxiety/Depression--on Paxil x 15 years, including during beginning of current pregnancy ('until I found out I was pregnant") BMI elevation (35)    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    Soc Hx no tobacco, etoh, ilicit drug use   Routine UA:  13-May-13 09:09    Color (UA) Yellow   Clarity (UA) Cloudy   Glucose (UA) Negative   Bilirubin (UA) Negative   Ketones (UA) Negative   Specific Gravity (UA) 1.009   Blood (UA) 1+   pH (UA) 8.0   Protein (UA) Negative   Nitrite (UA) Negative   Leukocyte Esterase (UA) 3+   RBC (UA) 2 /HPF    WBC (UA) 17 /HPF   Bacteria (UA) 1+   Epithelial Cells (UA) 5 /HPF   Mucous (UA) PRESENT  Blood Glucose:  13-May-13 09:17    POCT Blood Glucose 121     Additional Lab/Radiology Notes 4/30-5/10/13 F: 90-120 lunch and dinner :  94-170 (3 values out of range: 170, 165, 144)  reports startomg evening glyburide 2.5mg  HS on   sat, 5/11 fasting 105, 95, 85 two postprandial elevations on mothers day(sun 5/12): 176 and 165   Impression/Recommendations:   Impression 44 year old with gestational diabetes and maternal bmi 35.  We addressed complications associated with diabetes in pregnancy including the possible neonatal effects of poor glycemic control such as macrosomia, metabolic/hematological effects, and stillbirth .    We also reviewed issues such as the increased risk of preeclampsia in the setting of elevated bmi and maternal age. 2. BMI>335 3. Anxiety/Depression--she reports stable mood on Zoloft, however, prefers Paxil.  We addressed the slightly increased risk of fetal congenital heart defects (CHD) in neonates exposed to Paxil. Although she dicontinued this med with discovery of pregnancy, the timing of cessation is still within the window for fetal heart development. She had normal detailed fetal ultrasound today (including heart views)  We also addressed the transient tachypnea associated with all SSRI exposure during pregnancy.  We discussed the importance of  her continuing medications and the risks of postpartum depression.  We discussed fetal echocardiogram screening for better fetal CHD detection.   4. Hypothyroidism--we addressed association of poor control with preterm birth, growth disturbance, and cognitive dysfunction.  She reports increase in replacement dosage and recent thryoid function testing. We addressed the need for retesting approx 4w after dose adjustment. Pregnancy surveillance outlined below.    Recommendations --recommend [facility] and diabetes care --continue current glyburide dose --recommend antenatal surveillance as outlined below --amniocentesis and early delivery if fetopathy present (37-38 weeks) otherwise, delviery at [redacted] weeks gestation 2.  We addressed weight gain--she has not gained any weight during the pregnancy and is trying to be very conscious of maintaining a healthy diet.  We addressed IOM wt gain recs (15lbs). She does not plan to breastfeed.  3. We discussed continuation of current medications. She plans to resume Paxil postpartum. 4. Follow up in 2 weeks (with BPP). Scheduled.  --Recommend fetal echocardiogram (due to Paxil exposure and maternal age of 44) 3. Hypothyroidism--continue current medications.  She reports recent thyroid testing last week. 4.   Plan:   Prenatal Diagnosis Options Level II US    Antepartum Testing Twice weekly, Starting at 32 weeks    Delivery Mode Vaginal    Additional Testing Folate/prenatal vitamins    Delivery at what gestational age [redacted] weeks, sooner if fetopathy present     Total Time Spent with Patient 30 minutes    >50% of visit spent in couseling/coordination of care yes    Office Use Only [id number]  Level 2 (30min) NEW office consult exp prob focused   Coding Description: MATERNAL CONDITIONS/HISTORY INDICATION(S).   Abnormal glucose tolerance - if known by anatomy scan.   Adv. Maternal Age - multigravida.   Obesity - BMI greater than equal to 30.   Thyroid dysfunction in pregnancy.  Electronic Signatures: Consuelo PandySmall, Khloei Spiker (MD)  (Signed 13-May-13 14:14)  Authored: Referral, Home Medications, Allergies, Vital Signs/Notes, Consult, Lab, Lab/Radiology Notes, Impression, Plan, Billing, Coding Description   Last Updated: 13-May-13 14:14 by Consuelo PandySmall, Mauricia Mertens (MD)

## 2014-09-09 NOTE — H&P (Signed)
L&D Evaluation:  History:   HPI 44 yo G2P1001 at 7237w1d6w by EDC of 10/29/2011,Reports +FM, no LOF, no VB, but intermitten contractions since this morning, have since spaced out.  The patients pregnancy has been complicated by AMA, hypothyroidism, gestational diabetes, and GHTN.    Presents with contractions    Patient's Medical History Asthma  hypothyroidism, GHTN    Patient's Surgical History Colecystectomy    Medications Pre Natal Vitamins  glyburide 5mg  qHS and 1.25mg  qAM, levothyroxine 212mcg daily, zoloft 25mg  daily    Allergies NKDA    Social History none    Family History Non-Contributory   ROS:   ROS All systems were reviewed.  HEENT, CNS, GI, GU, Respiratory, CV, Renal and Musculoskeletal systems were found to be normal.   Exam:   Vital Signs stable    Urine Protein not completed    General no apparent distress    Mental Status clear    Abdomen gravid, non-tender    Estimated Fetal Weight Average for gestational age    Fetal Position vtx    Edema no edema    Reflexes 1+    Clonus negative    Pelvic Fingertip and long    Mebranes Intact    FHT normal rate with no decels    Ucx irregular, q10-5120min    Skin dry   Impression:   Impression Braxton Hick contractions   Plan:   Comments - routine labor precautions - Discharge home with follow up on 6/13    Follow Up Appointment already scheduled   Electronic Signatures: Lorrene ReidStaebler, Zakhi Dupre M (MD)  (Signed 09-Jun-13 14:27)  Authored: L&D Evaluation   Last Updated: 09-Jun-13 14:27 by Lorrene ReidStaebler, Araeya Lamb M (MD)

## 2014-09-09 NOTE — H&P (Signed)
L&D Evaluation:  History:   HPI 44 [facility] G2P1001 [facility],came to L&D this AM for scheduled NST and BP check and to drop off 24 hour urine. 24 urine done a few weeks WNL, growth scans and AFI's all WNL. The patients pregnancy has been complicated by AMA, hypothyroidism, gestational diabetes, and GHTN.    Presents with other, NST and BP check    Patient's Medical History Asthma  hypothyroidism, GHTN, GDM    Patient's Surgical History Colecystectomy    Medications Pre Natal Vitamins  glyburide 5mg  qHS and 1.25mg  qAM, levothyroxine 212mcg daily, zoloft 25mg  daily    Allergies NKDA    Social History none    Family History Non-Contributory   ROS:   ROS All systems were reviewed.  HEENT, CNS, GI, GU, Respiratory, CV, Renal and Musculoskeletal systems were found to be normal.   Exam:   Vital Signs stable  BP >140/90  some severe range BP's, mostly normotensive    Urine Protein not completed, 24 hour urine= 263 mg    General no apparent distress    Mental Status clear    Abdomen gravid, non-tender    Estimated Fetal Weight Average for gestational age    Edema no edema    Reflexes 1+    Clonus negative    Pelvic 1cm/long per office visit 2 days ago    Mebranes Intact    FHT normal rate with no decels, other, questionable variable decel vs loss of fix noted earlier this AM    Ucx rare    Skin dry   Impression:   Impression 38 weeks, High risk pregnancy, GDM, GHTN   Plan:   Plan EFM/NST, monitor BP, admit for IOL    Comments discussed plan of care with Dr. Jean RosenthalJackson (my backup MD) and will plan to keep pt considering some severe range BP's and do IOL with cervidil. Pt okay with plan.   Electronic Signatures: Shella Maximutnam, Imberly Troxler (CNM)  (Signed 15-Jun-13 14:46)  Authored: L&D Evaluation   Last Updated: 15-Jun-13 14:46 by Shella MaximPutnam, Jakerria Kingbird (CNM)

## 2014-09-09 NOTE — H&P (Signed)
L&D Evaluation:  History Expanded:   HPI 44 yo [facility]2P1001 [facility] pt called this am while working with c/o headache and BPs of 150s/90s from BP machine at pharmacy. Pt was encouraged to rest and take Tylenol and call back if not resolved. Pt was seen yesterday with similar complaints, PIH labs normal, PC Ratio 438. Pt has appt on 6/3 at office, will obtain 24 hour urine. Reports +FM, no LOF, no VB, no ctx.  The patients pregnancy has been complicated by AMA, hypothyroidism, gestational diabetes, and GHTN.    Blood Type O positive    Group B Strep Results (Result >5wks must be treated as unknown) unknown/result > 5 weeks ago    Maternal HIV Negative    Maternal Syphilis Ab Nonreactive    Maternal Varicella Non-Immune    Rubella Results immune    Maternal T-Dap Unknown    Patient's Medical History Asthma  hypothyroidism    Patient's Surgical History Colecystectomy    Medications Pre Natal Vitamins  glyburide 2.5mg  qHS and 1.25mg  qAM, levothyroxine 212mcg daily, zoloft 25mg  daily    Allergies NKDA    Social History none    Family History Non-Contributory   ROS:   ROS All systems were reviewed.  HEENT, CNS, GI, GU, Respiratory, CV, Renal and Musculoskeletal systems were found to be normal.   Exam:   Vital Signs other  128/76-155/88    Urine Protein negative dipstick    General no apparent distress    Mental Status clear    Abdomen gravid, non-tender    Edema no edema    Reflexes 1+    Clonus negative    FHT normal rate with no decels, had one questionable decel to 80, otherwise category 1 tracing for majority of monitoring, had 1 decel yesterday x 1 aswell that recovered with reposition    Ucx absent    Skin dry   Impression:   Impression evaluation for PIH   Plan:   Comments - Negative urine dip.  - Has follow up in clinic in 2 days, out of work until then. Will start 24 hour urine to turn in at next visit.   Electronic  Signatures: Vella KohlerBrothers, Breniyah Romm K (CNM)  (Signed 01-Jun-13 19:04)  Authored: L&D Evaluation   Last Updated: 01-Jun-13 19:04 by Vella KohlerBrothers, Legaci Tarman K (CNM)

## 2014-09-09 NOTE — H&P (Signed)
L&D Evaluation:  History:   HPI 44 yo G2P1001 [facility] 3960w6d [facility] calling on call physician today with persistant headache for past 24-hrs.  Reports +FM, no LOF, no VB, no ctx.  The patients pregnancy has been complicated by AMA, hypothyroidism, gestational diabetes, and GHTN.  The patient denies vision changes, sudden weight gain or increased edema, or RUQ/epigastric pain    Patient's Medical History No Chronic Illness  Asthma    Patient's Surgical History Colecystectomy    Medications Pre Natal Vitamins  glyburide 2.5mg  qHS and 1.25mg  qAM, levothyroxine 212mcg daily, zoloft 25mg  daily    Allergies NKDA    Family History Non-Contributory   ROS:   ROS All systems were reviewed.  HEENT, CNS, GI, GU, Respiratory, CV, Renal and Musculoskeletal systems were found to be normal.   Exam:   Vital Signs BP >140/90  normotensive to mild range BP's through monitoring here on L&D    Urine Protein negative dipstick    General no apparent distress    Mental Status clear    Chest clear    Abdomen gravid, non-tender    Estimated Fetal Weight Average for gestational age, vtx    Edema no edema    Reflexes 1+    Clonus negative    FHT normal rate with no decels, had one decel lasting 2 minutes with position change, was monitored for additional 1-hr showing reactive category I strip with good fetal movement throughout    Ucx absent   Impression:   Impression evaluation for PIH   Plan:   Comments - PIH panel negative - Negative urine dip. protein creatnine ratio elevated at 400 will obtain outpatient 24-hr urine - [facility] with [facility] will treat with macrobid - Has follow up in clinic in 3 days, discused signs symptoms of pre-eclampsia to be aware of and that should warrent re-presentation, routine labor precautions   Electronic Signatures: Lorrene ReidStaebler, Lyndel Dancel M (MD)  (Signed 31-May-13 22:15)  Authored: L&D Evaluation   Last Updated: 31-May-13 22:15  by Lorrene ReidStaebler, Zyrah Wiswell M (MD)

## 2022-04-22 ENCOUNTER — Other Ambulatory Visit: Payer: Self-pay

## 2022-04-22 DIAGNOSIS — Z1231 Encounter for screening mammogram for malignant neoplasm of breast: Secondary | ICD-10-CM

## 2022-08-08 ENCOUNTER — Ambulatory Visit
Admission: RE | Admit: 2022-08-08 | Discharge: 2022-08-08 | Disposition: A | Discharge status: Home / Self Care | Payer: Medicaid Other | Source: Ambulatory Visit | Attending: Family Medicine | Admitting: Family Medicine

## 2022-08-08 DIAGNOSIS — Z1231 Encounter for screening mammogram for malignant neoplasm of breast: Secondary | ICD-10-CM | POA: Insufficient documentation

## 2022-08-08 DIAGNOSIS — M5412 Radiculopathy, cervical region: Secondary | ICD-10-CM | POA: Diagnosis not present

## 2022-08-08 DIAGNOSIS — M25511 Pain in right shoulder: Secondary | ICD-10-CM | POA: Diagnosis not present

## 2022-08-08 DIAGNOSIS — M25512 Pain in left shoulder: Secondary | ICD-10-CM | POA: Diagnosis not present

## 2022-08-08 DIAGNOSIS — M5416 Radiculopathy, lumbar region: Secondary | ICD-10-CM | POA: Diagnosis not present

## 2022-08-18 ENCOUNTER — Other Ambulatory Visit: Payer: Self-pay | Admitting: Family Medicine

## 2022-08-18 DIAGNOSIS — N63 Unspecified lump in unspecified breast: Secondary | ICD-10-CM

## 2022-08-18 DIAGNOSIS — R928 Other abnormal and inconclusive findings on diagnostic imaging of breast: Secondary | ICD-10-CM

## 2022-08-18 DIAGNOSIS — M542 Cervicalgia: Secondary | ICD-10-CM | POA: Diagnosis not present

## 2022-08-18 DIAGNOSIS — M5412 Radiculopathy, cervical region: Secondary | ICD-10-CM | POA: Diagnosis not present

## 2022-08-24 ENCOUNTER — Ambulatory Visit
Admission: RE | Admit: 2022-08-24 | Discharge: 2022-08-24 | Disposition: A | Discharge status: Home / Self Care | Payer: Medicaid Other | Source: Ambulatory Visit | Attending: Family Medicine | Admitting: Family Medicine

## 2022-08-24 DIAGNOSIS — N63 Unspecified lump in unspecified breast: Secondary | ICD-10-CM | POA: Diagnosis not present

## 2022-08-24 DIAGNOSIS — R928 Other abnormal and inconclusive findings on diagnostic imaging of breast: Secondary | ICD-10-CM | POA: Insufficient documentation

## 2022-08-24 DIAGNOSIS — N6323 Unspecified lump in the left breast, lower outer quadrant: Secondary | ICD-10-CM | POA: Diagnosis not present

## 2022-08-24 DIAGNOSIS — M542 Cervicalgia: Secondary | ICD-10-CM | POA: Diagnosis not present

## 2022-08-24 DIAGNOSIS — M5412 Radiculopathy, cervical region: Secondary | ICD-10-CM | POA: Diagnosis not present

## 2022-08-29 ENCOUNTER — Other Ambulatory Visit: Payer: Self-pay | Admitting: Family Medicine

## 2022-08-29 DIAGNOSIS — R928 Other abnormal and inconclusive findings on diagnostic imaging of breast: Secondary | ICD-10-CM

## 2022-08-29 DIAGNOSIS — N63 Unspecified lump in unspecified breast: Secondary | ICD-10-CM

## 2022-08-30 DIAGNOSIS — M5412 Radiculopathy, cervical region: Secondary | ICD-10-CM | POA: Diagnosis not present

## 2022-08-30 DIAGNOSIS — M542 Cervicalgia: Secondary | ICD-10-CM | POA: Diagnosis not present

## 2022-08-31 ENCOUNTER — Ambulatory Visit
Admission: RE | Admit: 2022-08-31 | Discharge: 2022-08-31 | Disposition: A | Discharge status: Home / Self Care | Payer: Medicaid Other | Source: Ambulatory Visit | Attending: Family Medicine | Admitting: Family Medicine

## 2022-08-31 ENCOUNTER — Other Ambulatory Visit: Payer: Self-pay | Admitting: Family Medicine

## 2022-08-31 DIAGNOSIS — N6325 Unspecified lump in the left breast, overlapping quadrants: Secondary | ICD-10-CM | POA: Diagnosis not present

## 2022-08-31 DIAGNOSIS — N63 Unspecified lump in unspecified breast: Secondary | ICD-10-CM | POA: Diagnosis not present

## 2022-08-31 DIAGNOSIS — R928 Other abnormal and inconclusive findings on diagnostic imaging of breast: Secondary | ICD-10-CM

## 2022-08-31 DIAGNOSIS — N6342 Unspecified lump in left breast, subareolar: Secondary | ICD-10-CM | POA: Diagnosis not present

## 2022-08-31 HISTORY — PX: BREAST BIOPSY: SHX20

## 2022-08-31 MED ORDER — LIDOCAINE HCL (PF) 1 % IJ SOLN: 5.0000 mL | Freq: Once | INTRAMUSCULAR | Status: DC

## 2022-09-01 DIAGNOSIS — M542 Cervicalgia: Secondary | ICD-10-CM | POA: Diagnosis not present

## 2022-09-01 DIAGNOSIS — M5412 Radiculopathy, cervical region: Secondary | ICD-10-CM | POA: Diagnosis not present

## 2022-09-01 LAB — SURGICAL PATHOLOGY

## 2022-09-05 DIAGNOSIS — M5412 Radiculopathy, cervical region: Secondary | ICD-10-CM | POA: Diagnosis not present

## 2022-09-05 DIAGNOSIS — M542 Cervicalgia: Secondary | ICD-10-CM | POA: Diagnosis not present

## 2022-09-13 DIAGNOSIS — M5412 Radiculopathy, cervical region: Secondary | ICD-10-CM | POA: Diagnosis not present

## 2022-09-13 DIAGNOSIS — M542 Cervicalgia: Secondary | ICD-10-CM | POA: Diagnosis not present

## 2022-09-19 DIAGNOSIS — Z0131 Encounter for examination of blood pressure with abnormal findings: Secondary | ICD-10-CM | POA: Diagnosis not present

## 2022-09-19 DIAGNOSIS — H52223 Regular astigmatism, bilateral: Secondary | ICD-10-CM | POA: Diagnosis not present

## 2022-09-19 DIAGNOSIS — E119 Type 2 diabetes mellitus without complications: Secondary | ICD-10-CM | POA: Diagnosis not present

## 2022-09-19 DIAGNOSIS — Z1389 Encounter for screening for other disorder: Secondary | ICD-10-CM | POA: Diagnosis not present

## 2022-09-19 DIAGNOSIS — Z712 Person consulting for explanation of examination or test findings: Secondary | ICD-10-CM | POA: Diagnosis not present

## 2022-09-19 DIAGNOSIS — H524 Presbyopia: Secondary | ICD-10-CM | POA: Diagnosis not present

## 2022-09-19 DIAGNOSIS — E039 Hypothyroidism, unspecified: Secondary | ICD-10-CM | POA: Diagnosis not present

## 2022-09-22 DIAGNOSIS — M542 Cervicalgia: Secondary | ICD-10-CM | POA: Diagnosis not present

## 2022-09-22 DIAGNOSIS — M5412 Radiculopathy, cervical region: Secondary | ICD-10-CM | POA: Diagnosis not present

## 2022-09-29 DIAGNOSIS — H5213 Myopia, bilateral: Secondary | ICD-10-CM | POA: Diagnosis not present

## 2022-10-03 DIAGNOSIS — Z0120 Encounter for dental examination and cleaning without abnormal findings: Secondary | ICD-10-CM | POA: Diagnosis not present

## 2022-10-03 DIAGNOSIS — M5412 Radiculopathy, cervical region: Secondary | ICD-10-CM | POA: Diagnosis not present

## 2022-10-03 DIAGNOSIS — M1611 Unilateral primary osteoarthritis, right hip: Secondary | ICD-10-CM | POA: Diagnosis not present

## 2022-10-17 DIAGNOSIS — Z0130 Encounter for examination of blood pressure without abnormal findings: Secondary | ICD-10-CM | POA: Diagnosis not present

## 2022-10-17 DIAGNOSIS — Z1389 Encounter for screening for other disorder: Secondary | ICD-10-CM | POA: Diagnosis not present

## 2022-10-17 DIAGNOSIS — Z712 Person consulting for explanation of examination or test findings: Secondary | ICD-10-CM | POA: Diagnosis not present

## 2022-10-17 DIAGNOSIS — E119 Type 2 diabetes mellitus without complications: Secondary | ICD-10-CM | POA: Diagnosis not present

## 2022-10-17 DIAGNOSIS — E039 Hypothyroidism, unspecified: Secondary | ICD-10-CM | POA: Diagnosis not present

## 2022-10-24 DIAGNOSIS — M1611 Unilateral primary osteoarthritis, right hip: Secondary | ICD-10-CM | POA: Diagnosis not present

## 2022-11-15 DIAGNOSIS — H524 Presbyopia: Secondary | ICD-10-CM | POA: Diagnosis not present

## 2022-11-17 DIAGNOSIS — M5412 Radiculopathy, cervical region: Secondary | ICD-10-CM | POA: Diagnosis not present

## 2022-12-07 DIAGNOSIS — M5412 Radiculopathy, cervical region: Secondary | ICD-10-CM | POA: Diagnosis not present

## 2023-04-05 DIAGNOSIS — M25551 Pain in right hip: Secondary | ICD-10-CM | POA: Diagnosis not present

## 2023-04-05 DIAGNOSIS — M1611 Unilateral primary osteoarthritis, right hip: Secondary | ICD-10-CM | POA: Diagnosis not present

## 2023-04-19 DIAGNOSIS — Z0120 Encounter for dental examination and cleaning without abnormal findings: Secondary | ICD-10-CM | POA: Diagnosis not present

## 2023-06-26 DIAGNOSIS — M25512 Pain in left shoulder: Secondary | ICD-10-CM | POA: Diagnosis not present

## 2023-06-26 DIAGNOSIS — M5412 Radiculopathy, cervical region: Secondary | ICD-10-CM | POA: Diagnosis not present

## 2023-07-25 DIAGNOSIS — M62512 Muscle wasting and atrophy, not elsewhere classified, left shoulder: Secondary | ICD-10-CM | POA: Diagnosis not present

## 2023-07-25 DIAGNOSIS — M75122 Complete rotator cuff tear or rupture of left shoulder, not specified as traumatic: Secondary | ICD-10-CM | POA: Diagnosis not present

## 2023-07-25 DIAGNOSIS — M7552 Bursitis of left shoulder: Secondary | ICD-10-CM | POA: Diagnosis not present

## 2023-07-25 DIAGNOSIS — M129 Arthropathy, unspecified: Secondary | ICD-10-CM | POA: Diagnosis not present

## 2023-07-25 DIAGNOSIS — M25512 Pain in left shoulder: Secondary | ICD-10-CM | POA: Diagnosis not present

## 2023-07-27 DIAGNOSIS — M25512 Pain in left shoulder: Secondary | ICD-10-CM | POA: Diagnosis not present

## 2023-09-19 ENCOUNTER — Other Ambulatory Visit: Payer: Self-pay | Admitting: Family Medicine

## 2023-09-19 DIAGNOSIS — Z1231 Encounter for screening mammogram for malignant neoplasm of breast: Secondary | ICD-10-CM

## 2023-10-03 ENCOUNTER — Encounter

## 2023-10-17 ENCOUNTER — Encounter

## 2023-12-19 ENCOUNTER — Encounter

## 2024-01-23 DIAGNOSIS — Z1389 Encounter for screening for other disorder: Secondary | ICD-10-CM | POA: Diagnosis not present

## 2024-01-23 DIAGNOSIS — E039 Hypothyroidism, unspecified: Secondary | ICD-10-CM | POA: Diagnosis not present

## 2024-01-23 DIAGNOSIS — I10 Essential (primary) hypertension: Secondary | ICD-10-CM | POA: Diagnosis not present

## 2024-01-23 DIAGNOSIS — E119 Type 2 diabetes mellitus without complications: Secondary | ICD-10-CM | POA: Diagnosis not present

## 2024-01-23 DIAGNOSIS — Z1331 Encounter for screening for depression: Secondary | ICD-10-CM | POA: Diagnosis not present

## 2024-01-23 DIAGNOSIS — Z0000 Encounter for general adult medical examination without abnormal findings: Secondary | ICD-10-CM | POA: Diagnosis not present

## 2024-01-23 DIAGNOSIS — E781 Pure hyperglyceridemia: Secondary | ICD-10-CM | POA: Diagnosis not present

## 2024-03-05 DIAGNOSIS — J069 Acute upper respiratory infection, unspecified: Secondary | ICD-10-CM | POA: Diagnosis not present

## 2024-03-05 DIAGNOSIS — J028 Acute pharyngitis due to other specified organisms: Secondary | ICD-10-CM | POA: Diagnosis not present

## 2024-03-05 DIAGNOSIS — Z03818 Encounter for observation for suspected exposure to other biological agents ruled out: Secondary | ICD-10-CM | POA: Diagnosis not present

## 2024-03-05 DIAGNOSIS — J029 Acute pharyngitis, unspecified: Secondary | ICD-10-CM | POA: Diagnosis not present

## 2024-04-09 DIAGNOSIS — M1611 Unilateral primary osteoarthritis, right hip: Secondary | ICD-10-CM | POA: Diagnosis not present
